# Patient Record
Sex: Male | Born: 1985 | Race: White | Hispanic: No | Marital: Single | Smoking: Never smoker
Health system: Southern US, Community
[De-identification: ages and names within clinical notes are randomized; demographics above are authoritative.]

## PROBLEM LIST (undated history)

## (undated) DIAGNOSIS — J302 Other seasonal allergic rhinitis: Secondary | ICD-10-CM

## (undated) DIAGNOSIS — S96911A Strain of unspecified muscle and tendon at ankle and foot level, right foot, initial encounter: Secondary | ICD-10-CM

## (undated) HISTORY — PX: WISDOM TOOTH EXTRACTION: SHX21

---
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
## 2008-01-31 IMAGING — CR DG SHOULDER 2+V*L*
3 series · 3 of 3 positions shown · non-contrast
Comparison: None available.

CLINICAL DATA: Shoulder pain status post injury.  
 LEFT SHOULDER ? 3 VIEW:

[view not recorded (1 of 3)]
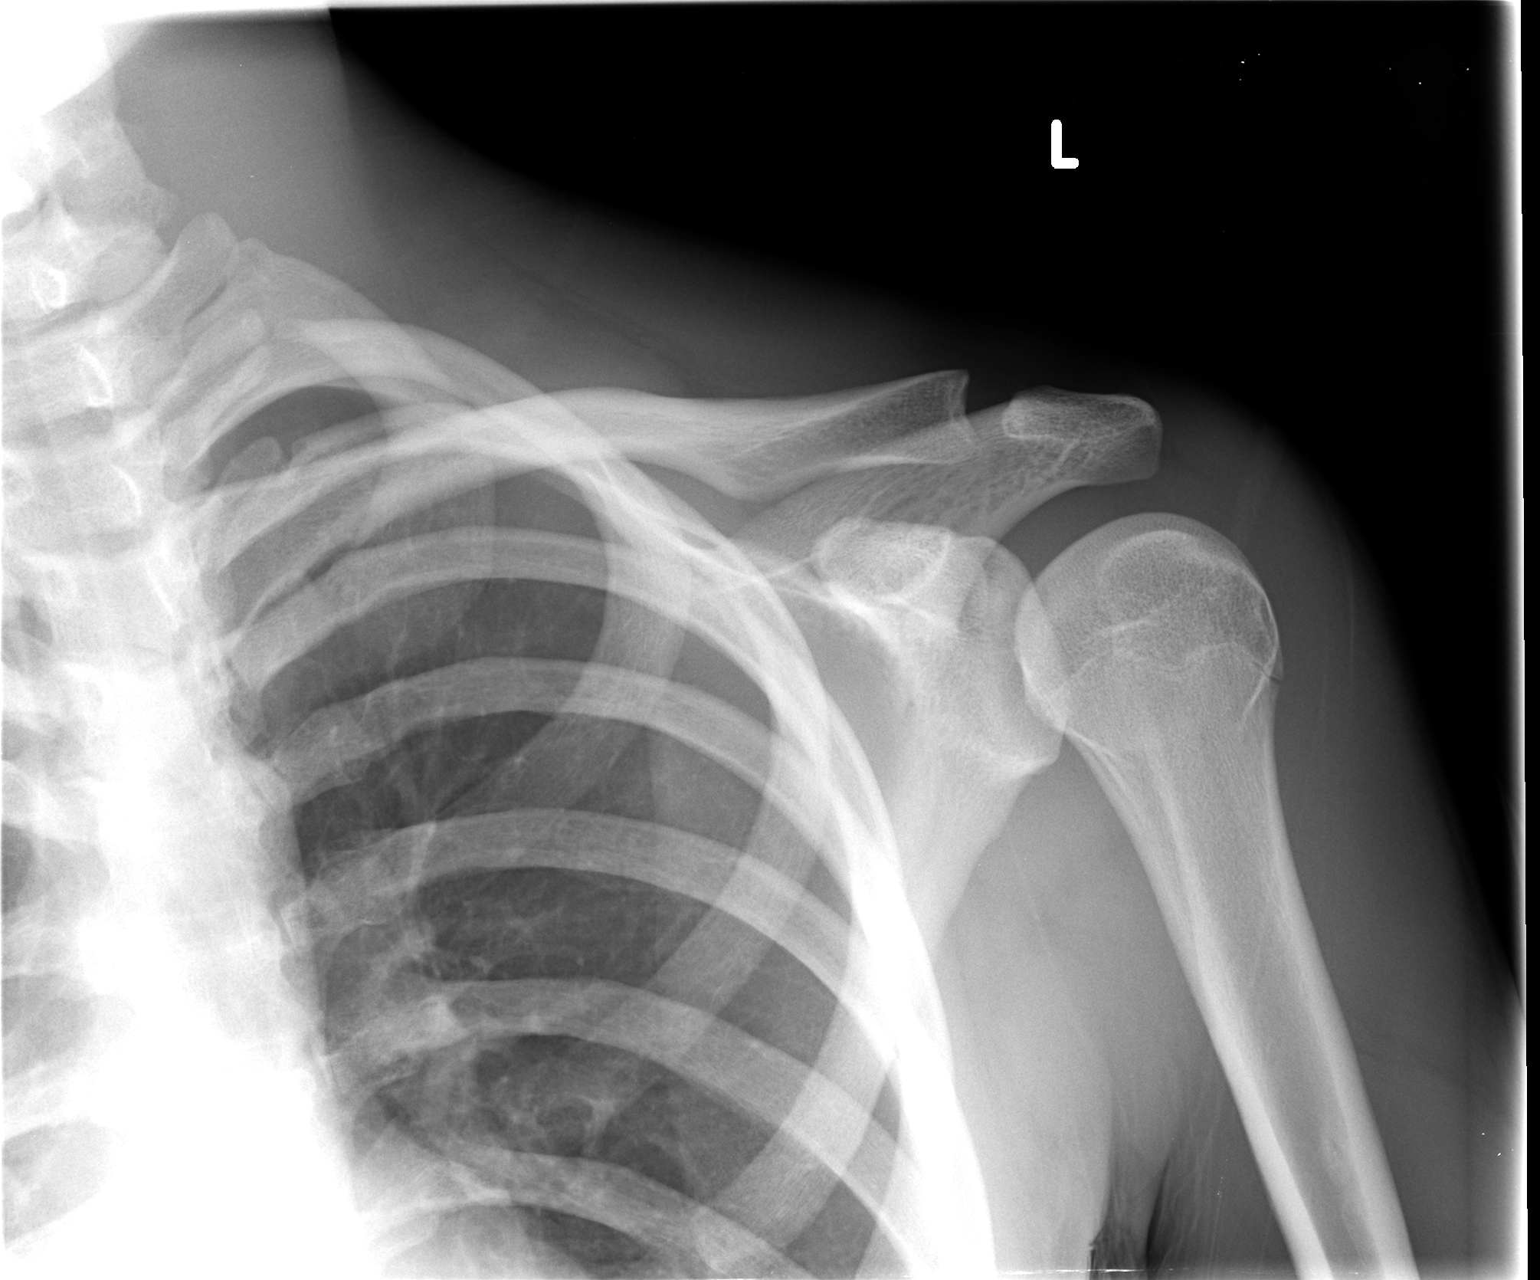

[view not recorded (2 of 3)]
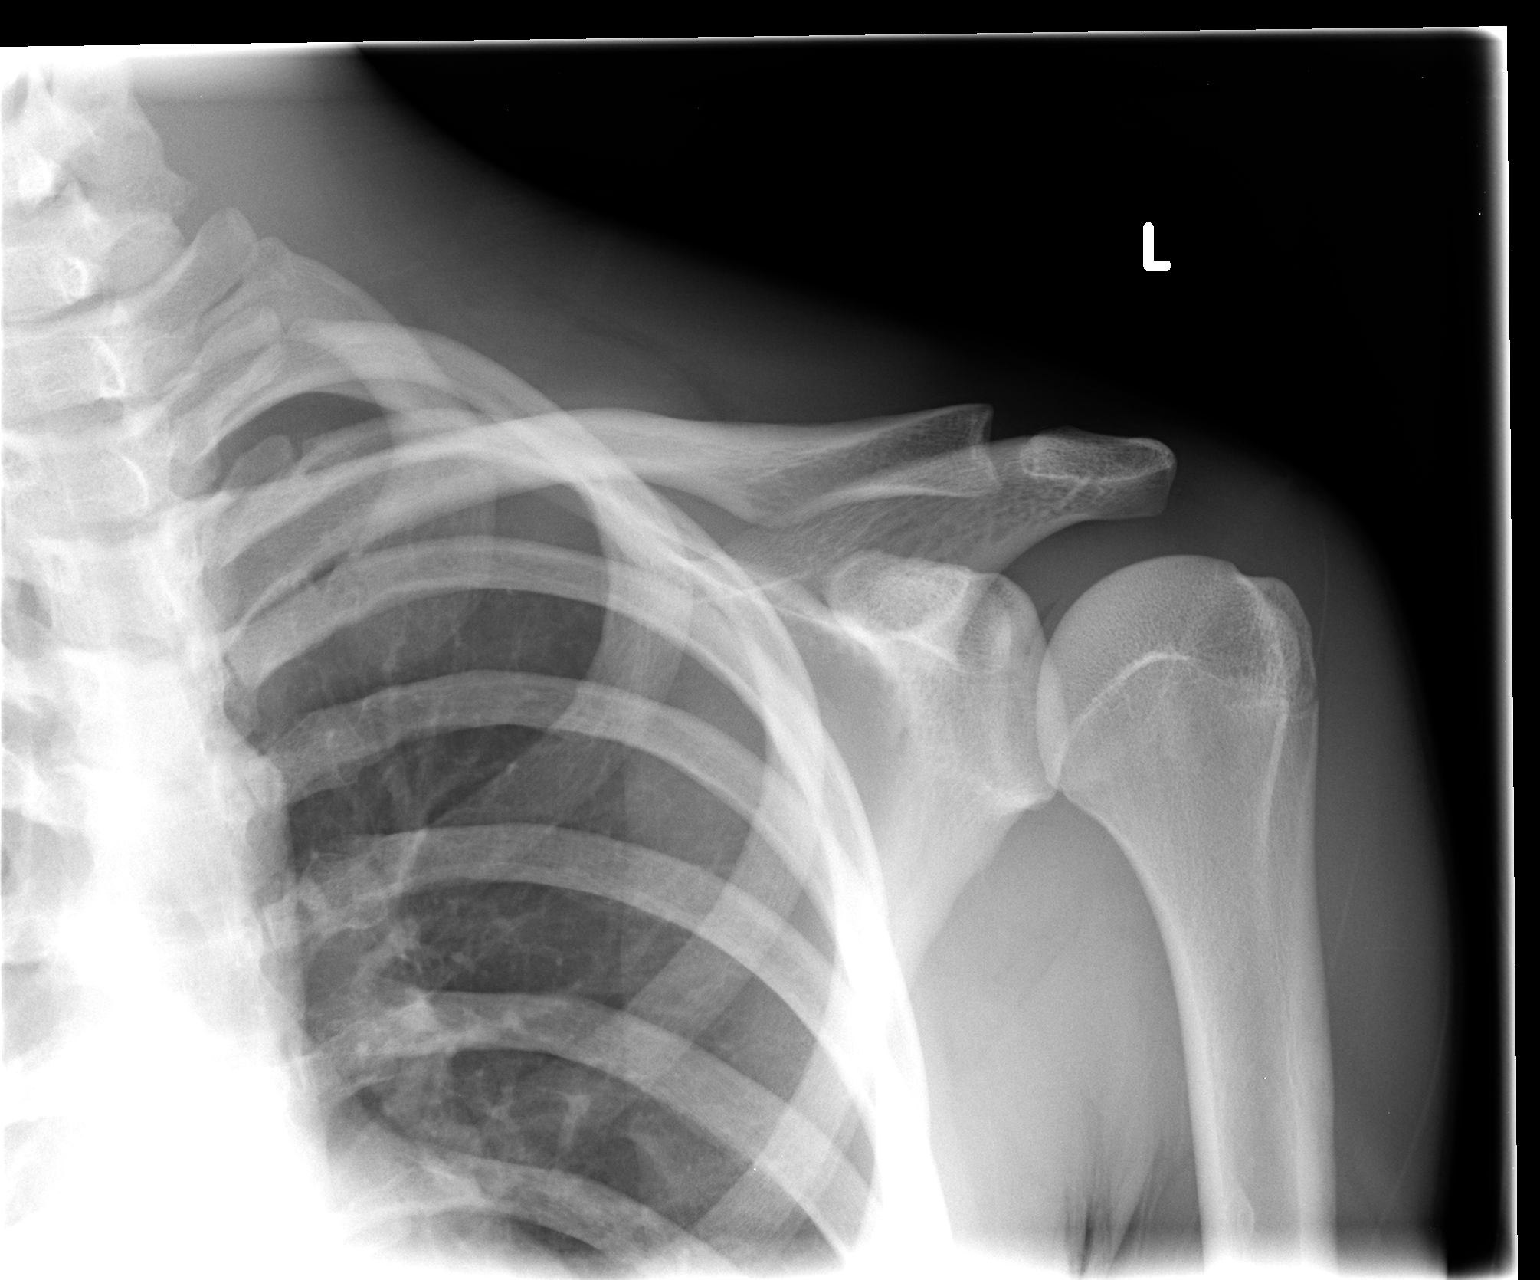

[view not recorded (3 of 3)]
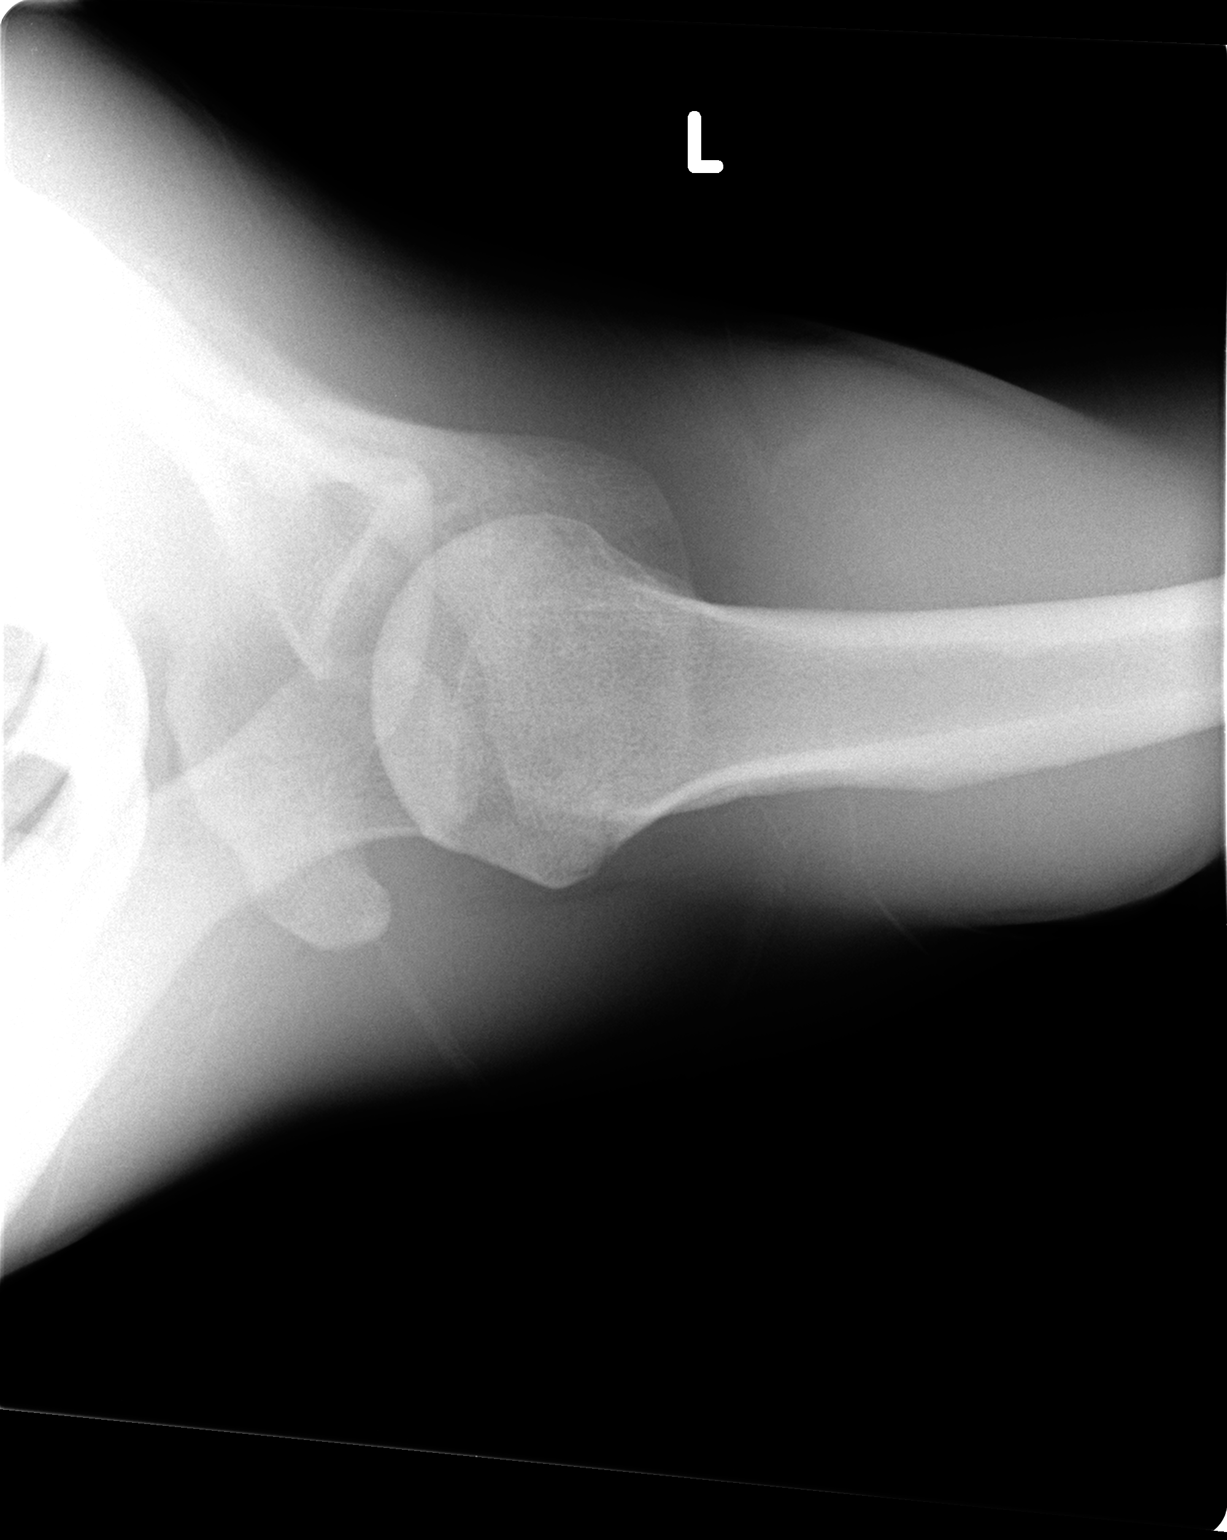

[3 of 3 positions shown; findings below may reference images not displayed]

FINDINGS: No dislocations are identified.  There are no fractures.
IMPRESSION: No acute findings.

## 2014-03-25 DIAGNOSIS — S96911A Strain of unspecified muscle and tendon at ankle and foot level, right foot, initial encounter: Secondary | ICD-10-CM

## 2014-03-25 HISTORY — DX: Strain of unspecified muscle and tendon at ankle and foot level, right foot, initial encounter: S96.911A

## 2014-04-07 ENCOUNTER — Encounter (HOSPITAL_BASED_OUTPATIENT_CLINIC_OR_DEPARTMENT_OTHER): Payer: Self-pay | Admitting: *Deleted

## 2014-04-10 ENCOUNTER — Encounter (HOSPITAL_BASED_OUTPATIENT_CLINIC_OR_DEPARTMENT_OTHER): Payer: Self-pay | Admitting: Certified Registered"

## 2014-04-10 ENCOUNTER — Ambulatory Visit (HOSPITAL_BASED_OUTPATIENT_CLINIC_OR_DEPARTMENT_OTHER): Payer: Worker's Compensation | Admitting: Anesthesiology

## 2014-04-10 ENCOUNTER — Ambulatory Visit (HOSPITAL_BASED_OUTPATIENT_CLINIC_OR_DEPARTMENT_OTHER)
Admission: RE | Admit: 2014-04-10 | Discharge: 2014-04-10 | Disposition: A | Discharge status: Home / Self Care | Payer: Worker's Compensation | Source: Ambulatory Visit | Attending: Orthopedic Surgery | Admitting: Orthopedic Surgery

## 2014-04-10 ENCOUNTER — Encounter (HOSPITAL_BASED_OUTPATIENT_CLINIC_OR_DEPARTMENT_OTHER)
Admission: RE | Disposition: A | Discharge status: Home / Self Care | Payer: Self-pay | Source: Ambulatory Visit | Attending: Orthopedic Surgery

## 2014-04-10 DIAGNOSIS — X58XXXA Exposure to other specified factors, initial encounter: Secondary | ICD-10-CM | POA: Insufficient documentation

## 2014-04-10 DIAGNOSIS — Y990 Civilian activity done for income or pay: Secondary | ICD-10-CM | POA: Diagnosis not present

## 2014-04-10 DIAGNOSIS — S93411A Sprain of calcaneofibular ligament of right ankle, initial encounter: Secondary | ICD-10-CM | POA: Insufficient documentation

## 2014-04-10 DIAGNOSIS — Y9269 Other specified industrial and construction area as the place of occurrence of the external cause: Secondary | ICD-10-CM | POA: Diagnosis not present

## 2014-04-10 DIAGNOSIS — S86311A Strain of muscle(s) and tendon(s) of peroneal muscle group at lower leg level, right leg, initial encounter: Secondary | ICD-10-CM | POA: Diagnosis not present

## 2014-04-10 DIAGNOSIS — S93491A Sprain of other ligament of right ankle, initial encounter: Secondary | ICD-10-CM | POA: Insufficient documentation

## 2014-04-10 HISTORY — DX: Other seasonal allergic rhinitis: J30.2

## 2014-04-10 HISTORY — DX: Strain of unspecified muscle and tendon at ankle and foot level, right foot, initial encounter: S96.911A

## 2014-04-10 HISTORY — PX: ANKLE RECONSTRUCTION: SHX1151

## 2014-04-10 SURGERY — RECONSTRUCTION, ANKLE
Anesthesia: Regional | Site: Ankle | Laterality: Right

## 2014-04-10 MED ORDER — OXYCODONE HCL 5 MG/5ML PO SOLN: 5.0000 mg | Freq: Once | ORAL | Status: DC | PRN

## 2014-04-10 MED ORDER — MIDAZOLAM HCL 2 MG/ML PO SYRP: 12.0000 mg | ORAL_SOLUTION | Freq: Once | ORAL | Status: DC | PRN

## 2014-04-10 MED ORDER — SENNOSIDES 8.6 MG PO TABS: 2.0000 | ORAL_TABLET | Freq: Every day | ORAL | Status: DC

## 2014-04-10 MED ORDER — LACTATED RINGERS IV SOLN
INTRAVENOUS | Status: DC
  Administered 2014-04-10: 10 mL/h via INTRAVENOUS

## 2014-04-10 MED ORDER — DEXAMETHASONE SODIUM PHOSPHATE 10 MG/ML IJ SOLN
INTRAMUSCULAR | Status: DC | PRN
  Administered 2014-04-10: 10 mg via INTRAVENOUS

## 2014-04-10 MED ORDER — CHLORHEXIDINE GLUCONATE 4 % EX LIQD: 60.0000 mL | Freq: Once | CUTANEOUS | Status: DC

## 2014-04-10 MED ORDER — ONDANSETRON HCL 4 MG/2ML IJ SOLN
INTRAMUSCULAR | Status: DC | PRN
  Administered 2014-04-10: 4 mg via INTRAVENOUS

## 2014-04-10 MED ORDER — MIDAZOLAM HCL 2 MG/2ML IJ SOLN
1.0000 mg | INTRAMUSCULAR | Status: DC | PRN
  Administered 2014-04-10: 2 mg via INTRAVENOUS

## 2014-04-10 MED ORDER — SCOPOLAMINE 1 MG/3DAYS TD PT72
1.0000 | MEDICATED_PATCH | TRANSDERMAL | Status: DC
  Administered 2014-04-10: 1.5 mg via TRANSDERMAL

## 2014-04-10 MED ORDER — KETOROLAC TROMETHAMINE 30 MG/ML IJ SOLN: 30.0000 mg | Freq: Once | INTRAMUSCULAR | Status: DC | PRN

## 2014-04-10 MED ORDER — FENTANYL CITRATE 0.05 MG/ML IJ SOLN
INTRAMUSCULAR | Status: AC
  Filled 2014-04-10: qty 2

## 2014-04-10 MED ORDER — BACITRACIN ZINC 500 UNIT/GM EX OINT
TOPICAL_OINTMENT | CUTANEOUS | Status: AC
  Filled 2014-04-10: qty 28.35

## 2014-04-10 MED ORDER — DOCUSATE SODIUM 100 MG PO CAPS: 100.0000 mg | ORAL_CAPSULE | Freq: Two times a day (BID) | ORAL | Status: AC

## 2014-04-10 MED ORDER — OXYCODONE HCL 5 MG PO TABS: 5.0000 mg | ORAL_TABLET | Freq: Once | ORAL | Status: DC | PRN

## 2014-04-10 MED ORDER — MIDAZOLAM HCL 5 MG/5ML IJ SOLN
INTRAMUSCULAR | Status: DC | PRN
  Administered 2014-04-10: 2 mg via INTRAVENOUS

## 2014-04-10 MED ORDER — SODIUM CHLORIDE 0.9 % IV SOLN: INTRAVENOUS | Status: DC

## 2014-04-10 MED ORDER — HYDROMORPHONE HCL 1 MG/ML IJ SOLN: 0.2500 mg | INTRAMUSCULAR | Status: DC | PRN

## 2014-04-10 MED ORDER — BACITRACIN ZINC 500 UNIT/GM EX OINT
TOPICAL_OINTMENT | CUTANEOUS | Status: DC | PRN
  Administered 2014-04-10: 1 via TOPICAL

## 2014-04-10 MED ORDER — PROMETHAZINE HCL 25 MG/ML IJ SOLN: 6.2500 mg | INTRAMUSCULAR | Status: DC | PRN

## 2014-04-10 MED ORDER — BUPIVACAINE-EPINEPHRINE (PF) 0.5% -1:200000 IJ SOLN
INTRAMUSCULAR | Status: AC
  Filled 2014-04-10: qty 30

## 2014-04-10 MED ORDER — 0.9 % SODIUM CHLORIDE (POUR BTL) OPTIME
TOPICAL | Status: DC | PRN
  Administered 2014-04-10: 200 mL

## 2014-04-10 MED ORDER — MIDAZOLAM HCL 2 MG/2ML IJ SOLN
INTRAMUSCULAR | Status: AC
  Filled 2014-04-10: qty 2

## 2014-04-10 MED ORDER — OXYCODONE HCL 5 MG PO TABS: 5.0000 mg | ORAL_TABLET | ORAL | Status: DC | PRN

## 2014-04-10 MED ORDER — VANCOMYCIN HCL IN DEXTROSE 1-5 GM/200ML-% IV SOLN
INTRAVENOUS | Status: AC
  Filled 2014-04-10: qty 200

## 2014-04-10 MED ORDER — PROPOFOL 10 MG/ML IV BOLUS
INTRAVENOUS | Status: DC | PRN
  Administered 2014-04-10: 200 mg via INTRAVENOUS

## 2014-04-10 MED ORDER — CELECOXIB 200 MG PO CAPS: 200.0000 mg | ORAL_CAPSULE | Freq: Two times a day (BID) | ORAL | Status: DC

## 2014-04-10 MED ORDER — SCOPOLAMINE 1 MG/3DAYS TD PT72
MEDICATED_PATCH | TRANSDERMAL | Status: AC
  Filled 2014-04-10: qty 1

## 2014-04-10 MED ORDER — ASPIRIN EC 325 MG PO TBEC: 325.0000 mg | DELAYED_RELEASE_TABLET | Freq: Every day | ORAL | Status: AC

## 2014-04-10 MED ORDER — VANCOMYCIN HCL IN DEXTROSE 1-5 GM/200ML-% IV SOLN
1000.0000 mg | INTRAVENOUS | Status: AC
  Administered 2014-04-10: 1000 mg via INTRAVENOUS

## 2014-04-10 MED ORDER — FENTANYL CITRATE 0.05 MG/ML IJ SOLN
50.0000 ug | INTRAMUSCULAR | Status: DC | PRN
  Administered 2014-04-10: 100 ug via INTRAVENOUS

## 2014-04-10 SURGICAL SUPPLY — 80 items
ANCH SUT 1 SHRT SM RGD INSRTR (Anchor) ×4 IMPLANT
ANCHOR SUT 1.45 SZ 1 SHORT (Anchor) ×6 IMPLANT
BANDAGE ESMARK 6X9 LF (GAUZE/BANDAGES/DRESSINGS) ×2 IMPLANT
BLADE AVERAGE 25X9 (BLADE) IMPLANT
BLADE OSC/SAG .038X5.5 CUT EDG (BLADE) IMPLANT
BLADE SURG 15 STRL LF DISP TIS (BLADE) ×6 IMPLANT
BLADE SURG 15 STRL SS (BLADE) ×9
BNDG CMPR 9X4 STRL LF SNTH (GAUZE/BANDAGES/DRESSINGS)
BNDG CMPR 9X6 STRL LF SNTH (GAUZE/BANDAGES/DRESSINGS) ×2
BNDG COHESIVE 4X5 TAN STRL (GAUZE/BANDAGES/DRESSINGS) ×3 IMPLANT
BNDG COHESIVE 6X5 TAN STRL LF (GAUZE/BANDAGES/DRESSINGS) ×2 IMPLANT
BNDG ESMARK 4X9 LF (GAUZE/BANDAGES/DRESSINGS) IMPLANT
BNDG ESMARK 6X9 LF (GAUZE/BANDAGES/DRESSINGS) ×3
CHLORAPREP W/TINT 26ML (MISCELLANEOUS) ×3 IMPLANT
COVER BACK TABLE 60X90IN (DRAPES) ×3 IMPLANT
CUFF TOURNIQUET SINGLE 34IN LL (TOURNIQUET CUFF) ×3 IMPLANT
DECANTER SPIKE VIAL GLASS SM (MISCELLANEOUS) IMPLANT
DRAPE C-ARM 42X72 X-RAY (DRAPES) IMPLANT
DRAPE C-ARMOR (DRAPES) IMPLANT
DRAPE EXTREMITY T 121X128X90 (DRAPE) ×3 IMPLANT
DRAPE OEC MINIVIEW 54X84 (DRAPES) ×2 IMPLANT
DRAPE U-SHAPE 47X51 STRL (DRAPES) ×3 IMPLANT
DRSG EMULSION OIL 3X3 NADH (GAUZE/BANDAGES/DRESSINGS) ×3 IMPLANT
DRSG PAD ABDOMINAL 8X10 ST (GAUZE/BANDAGES/DRESSINGS) ×6 IMPLANT
ELECT REM PT RETURN 9FT ADLT (ELECTROSURGICAL) ×3
ELECTRODE REM PT RTRN 9FT ADLT (ELECTROSURGICAL) ×2 IMPLANT
GAUZE SPONGE 4X4 12PLY STRL (GAUZE/BANDAGES/DRESSINGS) ×3 IMPLANT
GLOVE BIO SURGEON STRL SZ8 (GLOVE) ×3 IMPLANT
GLOVE BIOGEL PI IND STRL 7.0 (GLOVE) ×1 IMPLANT
GLOVE BIOGEL PI IND STRL 8 (GLOVE) ×2 IMPLANT
GLOVE BIOGEL PI INDICATOR 7.0 (GLOVE) ×1
GLOVE BIOGEL PI INDICATOR 8 (GLOVE) ×1
GLOVE ECLIPSE 6.5 STRL STRAW (GLOVE) ×3 IMPLANT
GLOVE EXAM NITRILE MD LF STRL (GLOVE) ×2 IMPLANT
GOWN STRL REUS W/ TWL LRG LVL3 (GOWN DISPOSABLE) ×2 IMPLANT
GOWN STRL REUS W/ TWL XL LVL3 (GOWN DISPOSABLE) ×2 IMPLANT
GOWN STRL REUS W/TWL LRG LVL3 (GOWN DISPOSABLE) ×3
GOWN STRL REUS W/TWL XL LVL3 (GOWN DISPOSABLE) ×3
K-WIRE .062X4 (WIRE) ×3 IMPLANT
KIT BIO-TENODESIS 3X8 DISP (MISCELLANEOUS)
KIT INSRT BABSR STRL DISP BTN (MISCELLANEOUS) IMPLANT
NDL SUT 6 .5 CRC .975X.05 MAYO (NEEDLE) ×1 IMPLANT
NEEDLE HYPO 22GX1.5 SAFETY (NEEDLE) ×3 IMPLANT
NEEDLE MAYO TAPER (NEEDLE) ×3
NS IRRIG 1000ML POUR BTL (IV SOLUTION) ×3 IMPLANT
PACK BASIN DAY SURGERY FS (CUSTOM PROCEDURE TRAY) ×3 IMPLANT
PAD CAST 4YDX4 CTTN HI CHSV (CAST SUPPLIES) ×2 IMPLANT
PADDING CAST ABS 4INX4YD NS (CAST SUPPLIES)
PADDING CAST ABS COTTON 4X4 ST (CAST SUPPLIES) IMPLANT
PADDING CAST COTTON 4X4 STRL (CAST SUPPLIES) ×3
PADDING CAST COTTON 6X4 STRL (CAST SUPPLIES) ×3 IMPLANT
PASSER SUT SWANSON 36MM LOOP (INSTRUMENTS) IMPLANT
PENCIL BUTTON HOLSTER BLD 10FT (ELECTRODE) ×3 IMPLANT
SANITIZER HAND PURELL 535ML FO (MISCELLANEOUS) ×3 IMPLANT
SCOTCHCAST PLUS 4X4 WHITE (CAST SUPPLIES) IMPLANT
SHEET MEDIUM DRAPE 40X70 STRL (DRAPES) ×3 IMPLANT
SLEEVE SCD COMPRESS KNEE MED (MISCELLANEOUS) ×3 IMPLANT
SPLINT FAST PLASTER 5X30 (CAST SUPPLIES) ×20
SPLINT PLASTER CAST FAST 5X30 (CAST SUPPLIES) ×40 IMPLANT
SPONGE LAP 18X18 X RAY DECT (DISPOSABLE) ×3 IMPLANT
STOCKINETTE 6  STRL (DRAPES) ×1
STOCKINETTE 6 STRL (DRAPES) ×2 IMPLANT
SUCTION FRAZIER TIP 10 FR DISP (SUCTIONS) IMPLANT
SUT ETHIBOND 0 MO6 C/R (SUTURE) ×3 IMPLANT
SUT ETHIBOND 2 OS 4 DA (SUTURE) IMPLANT
SUT ETHILON 3 0 PS 1 (SUTURE) ×3 IMPLANT
SUT FIBERWIRE 2-0 18 17.9 3/8 (SUTURE)
SUT MERSILENE 2.0 SH NDLE (SUTURE) IMPLANT
SUT MNCRL AB 3-0 PS2 18 (SUTURE) ×3 IMPLANT
SUT MNCRL AB 4-0 PS2 18 (SUTURE) IMPLANT
SUT VIC AB 0 SH 27 (SUTURE) ×2 IMPLANT
SUT VIC AB 2-0 SH 18 (SUTURE) IMPLANT
SUT VIC AB 2-0 SH 27 (SUTURE)
SUT VIC AB 2-0 SH 27XBRD (SUTURE) IMPLANT
SUT VICRYL 4-0 PS2 18IN ABS (SUTURE) IMPLANT
SUTURE FIBERWR 2-0 18 17.9 3/8 (SUTURE) IMPLANT
SYR BULB 3OZ (MISCELLANEOUS) ×3 IMPLANT
TOWEL OR 17X24 6PK STRL BLUE (TOWEL DISPOSABLE) ×6 IMPLANT
TUBE CONNECTING 20X1/4 (TUBING) IMPLANT
UNDERPAD 30X30 INCONTINENT (UNDERPADS AND DIAPERS) ×3 IMPLANT

## 2014-04-10 NOTE — Progress Notes (Signed)
Assisted Dr. Rob Fitzgerald with right, ultrasound guided, popliteal block. Side rails up, monitors on throughout procedure. See vital signs in flow sheet. Tolerated Procedure well. 

## 2014-04-10 NOTE — Anesthesia Postprocedure Evaluation (Signed)
  Anesthesia Post-op Note  Patient: Troy Glenn  Procedure(s) Performed: Procedure(s): RIGHT PERONEAL TENDON DEBRIDEMENT AND REPAIR;  PERONEAL RETINACULAR RECONSTRUCTION  (Right)  Patient Location: PACU  Anesthesia Type:GA combined with regional for post-op pain  Level of Consciousness: awake and alert   Airway and Oxygen Therapy: Patient Spontanous Breathing  Post-op Pain: mild  Post-op Assessment: Post-op Vital signs reviewed  Post-op Vital Signs: Reviewed  Last Vitals:  Filed Vitals:   04/10/14 1307  BP:   Pulse: 72  Temp:   Resp: 15    Complications: No apparent anesthesia complications

## 2014-04-10 NOTE — Discharge Instructions (Addendum)
Toni Arthurs, MD [facility]  Please read the following information regarding your care after surgery.  Medications  You only need a prescription for the narcotic pain medicine (ex. oxycodone, Percocet, Norco).  All of the other medicines listed below are available over the counter. X acetominophen (Tylenol) 650 mg every 4-6 hours as you need for minor pain X oxycodone as prescribed for moderate to severe pain X celebrex as prescribed for 5 days   Narcotic pain medicine (ex. oxycodone, Percocet, Vicodin) will cause constipation.  To prevent this problem, take the following medicines while you are taking any pain medicine. X docusate sodium (Colace) 100 mg twice a day X senna (Senokot) 2 tablets twice a day  X To help prevent blood clots, take an aspirin (325 mg) once a day for a month after surgery.  You should also get up every hour while you are awake to move around.    Weight Bearing ? Bear weight when you are able on your operated leg or foot. ? Bear weight only on the heel of your operated foot in the post-op shoe. X Do not bear any weight on the operated leg or foot.  Cast / Splint / Dressing X Keep your splint or cast clean and dry.  Dont put anything (coat hanger, pencil, etc) down inside of it.  If it gets damp, use a hair dryer on the cool setting to dry it.  If it gets soaked, call the office to schedule an appointment for a cast change. ? Remove your dressing 3 days after surgery and cover the incisions with dry dressings.    After your dressing, cast or splint is removed; you may shower, but do not soak or scrub the wound.  Allow the water to run over it, and then gently pat it dry.  Swelling It is normal for you to have swelling where you had surgery.  To reduce swelling and pain, keep your toes above your nose for at least 3 days after surgery.  It may be necessary to keep your foot or leg elevated for several weeks.  If it hurts, it should be  elevated.  Follow Up Call my office at [phone] when you are discharged from the hospital or surgery center to schedule an appointment to be seen two weeks after surgery.  Call my office at [phone] if you develop a fever >101.5 F, nausea, vomiting, bleeding from the surgical site or severe pain.    Regional Anesthesia Blocks  1. Numbness or the inability to move the "blocked" extremity may last from 3-48 hours after placement. The length of time depends on the medication injected and your individual response to the medication. If the numbness is not going away after 48 hours, call your surgeon.  2. The extremity that is blocked will need to be protected until the numbness is gone and the  Strength has returned. Because you cannot feel it, you will need to take extra care to avoid injury. Because it may be weak, you may have difficulty moving it or using it. You may not know what position it is in without looking at it while the block is in effect.  3. For blocks in the legs and feet, returning to weight bearing and walking needs to be done carefully. You will need to wait until the numbness is entirely gone and the strength has returned. You should be able to move your leg and foot normally before you try and bear weight or walk. You  will need someone to be with you when you first try to ensure you do not fall and possibly risk injury.  4. Bruising and tenderness at the needle site are common side effects and will resolve in a few days.  5. Persistent numbness or new problems with movement should be communicated to the surgeon or the [facility] [phone])/ [facility] [phone]).  Post Anesthesia Home Care Instructions  Activity: Get plenty of rest for the remainder of the day. A responsible adult should stay with you for 24 hours following the procedure.  For the next 24 hours, DO NOT: -Drive a car -Advertising copywriterperate machinery -Drink alcoholic  beverages -Take any medication unless instructed by your physician -Make any legal decisions or sign important papers.  Meals: Start with liquid foods such as gelatin or soup. Progress to regular foods as tolerated. Avoid greasy, spicy, heavy foods. If nausea and/or vomiting occur, drink only clear liquids until the nausea and/or vomiting subsides. Call your physician if vomiting continues.  Special Instructions/Symptoms: Your throat may feel dry or sore from the anesthesia or the breathing tube placed in your throat during surgery. If this causes discomfort, gargle with warm salt water. The discomfort should disappear within 24 hours.

## 2014-04-10 NOTE — Anesthesia Preprocedure Evaluation (Addendum)
Anesthesia Evaluation  Patient identified by MRN, date of birth, ID band Patient awake    Reviewed: Allergy & Precautions, NPO status , Patient's Chart, lab work & pertinent test results  Airway Mallampati: II  TM Distance: >3 FB Neck ROM: Full    Dental  (+) Teeth Intact, Dental Advisory Given   Pulmonary neg pulmonary ROS,  breath sounds clear to auscultation        Cardiovascular negative cardio ROS  Rhythm:Regular Rate:Normal     Neuro/Psych negative neurological ROS     GI/Hepatic negative GI ROS, Neg liver ROS,   Endo/Other  negative endocrine ROS  Renal/GU negative Renal ROS     Musculoskeletal   Abdominal   Peds  Hematology negative hematology ROS (+)   Anesthesia Other Findings   Reproductive/Obstetrics                            Anesthesia Physical Anesthesia Plan  ASA: I  Anesthesia Plan: General and Regional   Post-op Pain Management:    Induction: Intravenous  Airway Management Planned: LMA  Additional Equipment:   Intra-op Plan:   Post-operative Plan: Extubation in OR  Informed Consent: I have reviewed the patients History and Physical, chart, labs and discussed the procedure including the risks, benefits and alternatives for the proposed anesthesia with the patient or authorized representative who has indicated his/her understanding and acceptance.   Dental advisory given  Plan Discussed with: CRNA  Anesthesia Plan Comments:         Anesthesia Quick Evaluation

## 2014-04-10 NOTE — Anesthesia Procedure Notes (Addendum)
Anesthesia Regional Block:  Popliteal block  Pre-Anesthetic Checklist: ,, timeout performed, Correct Patient, Correct Site, Correct Laterality, Correct Procedure, Correct Position, site marked, Risks and benefits discussed,  Surgical consent,  Pre-op evaluation,  At surgeon's request and post-op pain management  Laterality: Right  Prep: chloraprep       Needles:  Injection technique: Single-shot  Needle Type: Echogenic Stimulator Needle     Needle Length: 9cm 9 cm Needle Gauge: 21 and 21 G    Additional Needles:  Procedures: ultrasound guided (picture in chart) and nerve stimulator Popliteal block  Nerve Stimulator or Paresthesia:  Response: tibial, 0.5 mA,  Response: peroneal, 0.5 mA,   Additional Responses:   Narrative:  Start time: 04/10/2014 10:00 AM End time: 04/10/2014 10:08 AM Injection made incrementally with aspirations every 5 mL.  Performed by: Personally  Anesthesiologist: Marcene DuosFITZGERALD, ROBERT  Additional Notes: Risks benefits explained. Pt tolerated procedure well with no immediate complications.   Procedure Name: LMA Insertion Date/Time: 04/10/2014 10:55 AM Performed by: Arshdeep Bolger D Pre-anesthesia Checklist: Patient identified, Emergency Drugs available, Suction available and Patient being monitored Patient Re-evaluated:Patient Re-evaluated prior to inductionOxygen Delivery Method: Circle System Utilized Preoxygenation: Pre-oxygenation with 100% oxygen Intubation Type: IV induction Ventilation: Mask ventilation without difficulty LMA: LMA inserted LMA Size: 4.0 Number of attempts: 1 Airway Equipment and Method: Bite block Placement Confirmation: positive ETCO2 Tube secured with: Tape Dental Injury: Teeth and Oropharynx as per pre-operative assessment

## 2014-04-10 NOTE — H&P (Signed)
Troy Glenn is an 29 y.o. male.   Chief Complaint: right ankle pain HPI: 29 y/o male with PMH of right ankle injury at work x 2 last year.  He has developed dislocating peroneal tendons and a peroneal tendon tear.  He has failed non operative treatment and presents now or surgery to correct these painful conditions.  Past Medical History  Diagnosis Date  . Tear of tendon of right ankle 03/2014    peroneus tendon tear, subluxing peroneal tendon  . Seasonal allergies     Past Surgical History  Procedure Laterality Date  . Shoulder arthroscopy Left 12/2005  . Wisdom tooth extraction      History reviewed. No pertinent family history. Social History:  reports that he has never smoked. He has never used smokeless tobacco. He reports that he does not drink alcohol or use illicit drugs.  Allergies:  Allergies  Allergen Reactions  . Ceclor [Cefaclor] Hives    No prescriptions prior to admission    No results found for this or any previous visit (from the past 48 hour(s)). No results found.  ROS  No recent f/c/nv//wt loss  Blood pressure 138/71, pulse 67, temperature 98.7 F (37.1 C), temperature source Oral, resp. rate 20, height 5' 9.5" (1.765 m), weight 78.744 kg (173 lb 9.6 oz), SpO2 100 %. Physical Exam  wn wd male in nad.  A and O x 4.  Mood and affect normal.  EOMI.  resp unlabored.  R ankle with healthy skin.  Peroneals have 5/5 strength but subluxate with resisted strength testing.  TTP at peroneals.  Skin healthy and intact.  No lymphadenoapthy.  Normal NV exam.  Assessment/Plan R ankle dislocating peroneal tendons and peroneal tendon tear.  To or for reconstruction of the SPR and tendon debridement or repair of the peroneals.  The risks and benefits of the alternative treatment options have been discussed in detail.  The patient wishes to proceed with surgery and specifically understands risks of bleeding, infection, nerve damage, blood clots, need for additional surgery,  amputation and death.   Toni ArthursHEWITT, Revella Shelton 04/10/2014, 9:49 AM

## 2014-04-10 NOTE — Brief Op Note (Signed)
04/10/2014  12:18 PM  PATIENT:  Delma Officerathan Lipuma  29 y.o. male  PRE-OPERATIVE DIAGNOSIS: 1.  Right peroneus brevis tendon tear      2.  Dislocating peroneal tendons  POST-OPERATIVE DIAGNOSIS: 1.  Right dislocating peroneal tendons (from avulsion of the superior peroneal retinaculum)      2.  Tenosynovitis of peroneus longus and peroneus brevis tendons      3.  Right ankle lateral instability (avulsion of ATFL and CFL)  Procedure(s): 1.  Right peroneus brevis tenolysis   2.  Right peroneus longus tenolysis   3.  Repair of dislocating peroneal tendons (reconstruction of superior peroneal retinaculum)   4.  Secondary repair of lateral ankle ligaments (avulsed ATFL and CFL)  SURGEON:  Toni ArthursJohn Naol Ontiveros, MD  ASSISTANT: n/a  ANESTHESIA:   General, regional  EBL:  minimal   TOURNIQUET:   Total Tourniquet Time Documented: Thigh (Right) - 54 minutes Total: Thigh (Right) - 54 minutes  COMPLICATIONS:  None apparent  DISPOSITION:  Extubated, awake and stable to recovery.  DICTATION ID:  [id number]

## 2014-04-10 NOTE — Transfer of Care (Signed)
Immediate Anesthesia Transfer of Care Note  Patient: Troy Glenn  Procedure(s) Performed: Procedure(s): RIGHT PERONEAL TENDON DEBRIDEMENT AND REPAIR;  POSSIBLE FIBULAR GROOVE DEEPENING AND PERONEAL RETINACULAR RECONSTRUCTION  (Right)  Patient Location: PACU  Anesthesia Type:GA combined with regional for post-op pain  Level of Consciousness: awake and patient cooperative  Airway & Oxygen Therapy: Patient Spontanous Breathing and Patient connected to face mask oxygen  Post-op Assessment: Report given to RN and Post -op Vital signs reviewed and stable  Post vital signs: Reviewed and stable  Last Vitals:  Filed Vitals:   04/10/14 1212  BP:   Pulse: 79  Temp:   Resp: 10    Complications: No apparent anesthesia complications

## 2014-04-11 ENCOUNTER — Encounter (HOSPITAL_BASED_OUTPATIENT_CLINIC_OR_DEPARTMENT_OTHER): Payer: Self-pay | Admitting: Orthopedic Surgery

## 2014-04-11 NOTE — Op Note (Signed)
NAME:  Troy Glenn, Troy Glenn                ACCOUNT NO.:  [id number]  MEDICAL RECORD NO.:  [id number]  LOCATION:                                 FACILITY:  PHYSICIAN:  Troy ArthursJohn Roxi Hlavaty, MD             DATE OF BIRTH:  DATE OF PROCEDURE:  04/10/2014 DATE OF DISCHARGE:                              OPERATIVE REPORT   PREOPERATIVE DIAGNOSES: 1. Right peroneus brevis tendon tear. 2. Dislocating peroneal tendons.  POSTOPERATIVE DIAGNOSES: 1. Right dislocating peroneal tendons from an evulsion of the superior     peroneal retinaculum. 2. Tenosynovitis of the peroneus brevis and peroneus longus tendons. 3. Right ankle lateral ligament instability from avulsion of the     anterior talofibular ligament and the calcaneofibular ligament.  PROCEDURES: 1. Right peroneus brevis tenolysis. 2. Right peroneus longus tenolysis. 3. Repair of dislocating peroneal tendons with reconstruction of the     superior peroneal retinaculum. 4. Secondary repair of the lateral ankle ligaments (avulsed anterior     talofibular ligament and calcaneofibular ligament).  SURGEON:  Troy ArthursJohn Genavie Boettger, MD  ANESTHESIA:  General, regional.  ESTIMATED BLOOD LOSS:  Minimal.  TOURNIQUET TIME:  54 minutes at 250 mmHg.  COMPLICATIONS:  None apparent.  DISPOSITION:  Extubated, awake and stable to recovery.  INDICATIONS FOR PROCEDURE:  The patient is a 29 year old male who injured his right ankle at work on two different occasions last year. He has gone onto have signs and symptoms consistent with dislocating peroneal tendons.  He has failed nonoperative treatment to date including physical therapy and bracing.  An MRI shows injury to the superior peroneal retinaculum as well as peroneus brevis tendon tear. He understands the risks and benefits, the alternative treatment options and elects surgical treatment.  He specifically understands risks of bleeding, infection, nerve damage, blood clots, need for additional surgery, continued  pain, amputation, and death.  PROCEDURE IN DETAIL:  After preoperative consent was obtained and the correct operative site was identified, the patient was brought to the operating room and placed supine on the operating table.  General anesthesia was induced.  Preoperative antibiotics were administered. Surgical time-out was taken.  The right lower extremity was prepped and draped in standard sterile fashion and tourniquet around the thigh. Extremity was exsanguinated and tourniquet was inflated to 250 mmHg.  A curvilinear incision was made over the lateral malleolus.  Sharp dissection was carried down through the skin and subcutaneous tissue. The peroneal tendon sheath was incised.  The superior peroneal retinaculum was released from the posterior edge of the fibula sharply. The entire periosteal sleeve distally including the origins of the anterior talofibular ligament and calcaneofibular ligament along with the insertion of the superior peroneal retinaculum was noted to be avulsed from the distal fibula.  The peroneal tendons could easily be dislocated into this avulsed periosteum.  The distal portion of the fibula was decorticated.  The peroneus brevis and longus tendons were then carefully inspected.  They were noted to have extensive tenosynovitis over both tendons longitudinally.  The patient was also noted to have a peroneus quartus anatomic variant.  The peroneus quartus muscle and tendon both appeared healthy and  intact.  The longus and brevis tendons were noted to be healthy and intact with no evidence of tearing.  Both had extensive synovitis, which was sharply excised with scissors and scalpel.  The ATFL and CFL were again noted to be avulsed from the distal fibula at their origins.  The decision was made to proceed with lateral ligament reconstruction in an effort to retention these avulsed ligaments.  The anterior fibula as well as distal fibular were decorticated with a  rongeur and curette.  Two Bi[employer] JuggerKnot #1 anchors were inserted, one at the origin of the ATFL and one at the origin of the CFL.  Both were noted to have excellent purchase. Horizontal mattress sutures were then passed into the ATFL and CFL.  The ankle was held in a reduced position and the sutures were tightened down pulling the origin of the ATFL and origin of the CFL back up on to the appropriate location on the distal fibula.  These sutures were then passed through the extensor retinaculum and the retinaculum was advanced up on to the anterior aspect of the fibula as well.  The superior peroneal retinaculum was then repaired back to the trailing edge of the fibula through drill holes.  The fibular groove was noted to be appropriately deep, so fibular osteotomy was unnecessary.  With the retinaculum repaired, there was no evidence of dislocation or subluxation of the peroneal tendons.  The wound was irrigated copiously. The avulsed periosteum was then repaired to the deep subcutaneous tissue overlying the retinacular repair.  This pulled the fibular periosteum securely back down to the bone.  The superficial subcutaneous tissue was then approximated with inverted simple sutures of 3-0 Monocryl and a running 3-0 nylon was used to close the skin incision.  Sterile dressings were applied followed by well-padded short-leg splint.  The patient's previously grade 1 anterior drawer was reduced to grade 0 prior to application of the splint.  The patient's tourniquet was released at 54 minutes after application of the dressings.  The patient was awakened from anesthesia and transported to the recovery room in stable condition.  FOLLOWUP PLAN:  The patient will be nonweightbearing on the right lower extremity.  He will follow up with me in 2 weeks in the office for suture removal and conversion to a short-leg cast.  The patient will start aspirin for DVT prophylaxis while he is  nonweightbearing.     Troy Arthurs, MD     JH/MEDQ  D:  04/10/2014  T:  04/11/2014  Job:  [id number]

## 2015-02-20 ENCOUNTER — Ambulatory Visit: Payer: Self-pay | Admitting: Family Medicine

## 2015-02-26 ENCOUNTER — Other Ambulatory Visit (HOSPITAL_COMMUNITY): Payer: Self-pay | Admitting: Sports Medicine

## 2015-02-26 DIAGNOSIS — M546 Pain in thoracic spine: Secondary | ICD-10-CM

## 2015-03-04 ENCOUNTER — Encounter (HOSPITAL_COMMUNITY): Payer: Self-pay

## 2015-03-06 ENCOUNTER — Ambulatory Visit: Payer: Self-pay | Admitting: Family Medicine

## 2015-03-16 ENCOUNTER — Encounter (HOSPITAL_COMMUNITY)
Admission: RE | Admit: 2015-03-16 | Discharge: 2015-03-16 | Disposition: A | Discharge status: Home / Self Care | Payer: Worker's Compensation | Source: Ambulatory Visit | Attending: Sports Medicine | Admitting: Sports Medicine

## 2015-03-16 ENCOUNTER — Other Ambulatory Visit (HOSPITAL_COMMUNITY): Payer: Self-pay | Admitting: Sports Medicine

## 2015-03-16 DIAGNOSIS — M546 Pain in thoracic spine: Secondary | ICD-10-CM | POA: Insufficient documentation

## 2015-03-16 MED ORDER — TECHNETIUM TC 99M MEDRONATE IV KIT
26.1000 | PACK | Freq: Once | INTRAVENOUS | Status: AC | PRN
  Administered 2015-03-16: 26.1 via INTRAVENOUS

## 2015-03-20 ENCOUNTER — Ambulatory Visit: Payer: Self-pay | Admitting: Family Medicine

## 2015-04-21 ENCOUNTER — Ambulatory Visit: Payer: Self-pay | Admitting: Sports Medicine

## 2015-05-12 ENCOUNTER — Encounter: Payer: Self-pay | Admitting: Sports Medicine

## 2015-05-12 ENCOUNTER — Ambulatory Visit (INDEPENDENT_AMBULATORY_CARE_PROVIDER_SITE_OTHER): Payer: BLUE CROSS/BLUE SHIELD | Admitting: Sports Medicine

## 2015-05-12 VITALS — BP 100/70 | Ht 71.0 in | Wt 180.0 lb

## 2015-05-12 DIAGNOSIS — G2589 Other specified extrapyramidal and movement disorders: Secondary | ICD-10-CM | POA: Diagnosis not present

## 2015-05-12 MED ORDER — AMITRIPTYLINE HCL 25 MG PO TABS: 25.0000 mg | ORAL_TABLET | Freq: Every day | ORAL | Status: AC

## 2015-05-12 NOTE — Progress Notes (Signed)
Patient ID: Troy Glenn, male   DOB: September 07, 1985, 30 y.o.   MRN: [id number]  CC: Right scapular pain  HPI: 30 yo man presents with two years of right posterior shoulder pain, stiffness, burning and reduced range of motion. Symptoms with overhead motion, reaching, lifting or even repetitive movements in front of body such as washing dishes cause pain and burning over medial border of right scapula. Denies frank weakness. Had a lifting injury about 2 years ago which caused back pain and felt a pop in that area. Believes his symptoms are from that. He has tried NSAIDs briefly, scapular stabilization rehab on his own with pulldowns, rows, etc. and massage, none of which have helped. Occasionally has to lift at work now but not often. No longer lifts weights due to injury.  He denies pain or numbness/tingling radiating into his arm or hand. No left shoulder symptoms. No constitutional symptoms.   ROS: As above, otherwise feels well today.  PMH: Seasonal allergies, left shoulder surgery, right ankle surgery  PE: BP 100/70 mmHg  Ht 5\' 11"  (1.803 m)  Wt 180 lb (81.647 kg)  BMI 25.12 kg/m2   GEN: WDWN, athletic man, NAD. Pleasant and cooperative. CV/PULM: Nonlabored breathing, skin is warm, no edema, peripheral circulation grossly intact. NEURO: Symmetrical facial movement and full upper limb strength. UE sensation intact to light tough.  MSK: Most notable finding is right scapular dyskinesis, which appears to catch at about 110 degrees of abduction, and at same relative point upon adduction back to neutral. This abnormal motion is corrected with scapular compression/stabliziation maneuver. No winging or atrophy. Moderate hypertonicity of paraspinal/right medial scapular and trapezius musculature, but no specific abnormality upon palpation. Overlying skin is normal. Provocative tests of rotator cuff do not cause pain. Normal cervical spine and shoulder ROM. No significant scoliosis noted.   A/P: Right  scapular dyskinesis. Possible adhesion and/or nerve compression/stretch from partial tendon/muscle/fascial tear anterior to scapula at time of injury 2 years ago. Trial of medication and exercise as below, return in 6 weeks.  Exercises prescribed and demonstrated Full shoulder lateral rotation/ shoulder blade against the wall Superior/ inferior scapular motion with blade agaisnt the wall Ball or roller between shoulder blades and stretch shoulders to the floor Do 3 stretches of 30 secs  At night time take 25 mg. Amitriptyline  -strong muscle relaxant and blocks nerve irritation. Medication side effects discussed.

## 2015-05-12 NOTE — Patient Instructions (Signed)
You have scapular dyskinesis This means that at some point you injured muscle/ tendon and probably stretched the nerve that helps stabilize the scapula  Exercises Full shoulder lateral rotation/ shoulder blade against the wall Superior/ inferior scapular motion with blade agaisnt the wall Ball or roller between shoulder blades and stretch shoulders to the floor Do 3 stretches of 30 secs  At night time take 25 mg. Amitriptyline  -strong muscle relaxant and blocks nerve irritation  See us in 6 weeks

## 2015-05-12 NOTE — Assessment & Plan Note (Signed)
See plan in note

## 2015-07-01 ENCOUNTER — Ambulatory Visit: Payer: Self-pay | Admitting: Sports Medicine

## 2015-07-29 ENCOUNTER — Ambulatory Visit (INDEPENDENT_AMBULATORY_CARE_PROVIDER_SITE_OTHER): Payer: BLUE CROSS/BLUE SHIELD | Admitting: Sports Medicine

## 2015-07-29 ENCOUNTER — Encounter: Payer: Self-pay | Admitting: Sports Medicine

## 2015-07-29 VITALS — BP 140/94 | HR 95 | Ht 71.0 in | Wt 180.0 lb

## 2015-07-29 DIAGNOSIS — S73191A Other sprain of right hip, initial encounter: Secondary | ICD-10-CM | POA: Diagnosis not present

## 2015-07-29 DIAGNOSIS — M25551 Pain in right hip: Secondary | ICD-10-CM | POA: Insufficient documentation

## 2015-07-29 DIAGNOSIS — G2589 Other specified extrapyramidal and movement disorders: Secondary | ICD-10-CM | POA: Diagnosis not present

## 2015-07-29 NOTE — Assessment & Plan Note (Signed)
He has significant improvement He should continue the exercises at least 3 times a week for scapular stabilization and for posture  This should resolve and not leave him any limitations He may stop medications

## 2015-07-29 NOTE — Patient Instructions (Signed)
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

## 2015-07-29 NOTE — Progress Notes (Signed)
Patient ID: Troy Glenn, male   DOB: [birth year], 30 y.o.   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 This is much improved In the past month he perhaps had pain on 2 days Much less clicking around the shoulder No nighttime pain No limitations of his normal activity  A new problem that has bothered him off and on over the past year is right hip tightness This is the lateral aspect of the hip This started after he had a reconstruction of an ankle ligament about 18 months ago  Review of systems No neck pain No radicular symptoms into his arms No sciatic pain No night pain from his hip  Physical examination Muscular white male in no acute distress BP 140/94 mmHg  Pulse 95  Ht 5\' 11"  (1.803 m)  Wt 180 lb (81.647 kg)  BMI 25.12 kg/m2  RT Shoulder: Inspection reveals no abnormalities, atrophy or asymmetry. Palpation is normal with no tenderness over AC joint or bicipital groove. ROM is full in all planes. Rotator cuff strength normal throughout. No signs of impingement with negative Neer and Hawkin's tests, empty can. Speeds and Yergason's tests normal. No labral pathology noted with negative Obrien's, negative clunk and good stability. Normal scapular function observed. No painful arc and no drop arm sign. No apprehension sign  Biggest changes are that his scapular motion has normalized and he has resolved the scapular protraction  Right hip examination Full range of motion No tenderness over the piriformis No tenderness over the greater trochanter Significant weakness of the right gluteus medius This is not present on the left side All other hip muscles are strong

## 2015-07-29 NOTE — Assessment & Plan Note (Signed)
I suspect while he had the ankle issues and while he recovered from surgery he stopped using his gluteus medius Now he has residual weakness and is compensating by using other muscle groups  I think this will improve dramatically with a home exercise program Recheck in 2 months

## 2015-09-29 ENCOUNTER — Ambulatory Visit: Payer: Self-pay | Admitting: Sports Medicine

## 2015-10-27 ENCOUNTER — Encounter: Payer: Self-pay | Admitting: Sports Medicine

## 2015-10-27 ENCOUNTER — Ambulatory Visit (INDEPENDENT_AMBULATORY_CARE_PROVIDER_SITE_OTHER): Payer: BLUE CROSS/BLUE SHIELD | Admitting: Sports Medicine

## 2015-10-27 DIAGNOSIS — G2589 Other specified extrapyramidal and movement disorders: Secondary | ICD-10-CM | POA: Diagnosis not present

## 2015-10-27 DIAGNOSIS — M25551 Pain in right hip: Secondary | ICD-10-CM | POA: Diagnosis not present

## 2015-10-27 NOTE — Assessment & Plan Note (Signed)
Scap stabilization exercises seem to have worked  Keep these up at least 3 x week  Be caustious with lifts overhead  Reck prn

## 2015-10-27 NOTE — Progress Notes (Signed)
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 Palpation is normal with no tenderness over AC joint or bicipital groove. ROM is full in all planes. Rotator cuff strength normal throughout. No signs of impingement with negative Neer and Hawkin's tests, empty can. Speeds and Yergason's tests normal. No labral pathology noted with negative Obrien's, negative clunk and good stability. Normal scapular function observed. No painful arc and no drop arm sign. No apprehension sign  RT Hip Normal ROM No pain with stress Hip flexion is strong Hip abduction now strong No pain over glut med

## 2015-10-27 NOTE — Assessment & Plan Note (Signed)
This is glut med syndrome  Gained a lot of strength with exercises Keep up exercises at least 3 x week  Afraid to run but I think much of this is fear from prior ankle surgery Can stay as active as pain allows

## 2016-07-01 DIAGNOSIS — Z130 Encounter for screening for diseases of the blood and blood-forming organs and certain disorders involving the immune mechanism: Secondary | ICD-10-CM | POA: Diagnosis not present

## 2016-07-01 DIAGNOSIS — Z6826 Body mass index (BMI) 26.0-26.9, adult: Secondary | ICD-10-CM | POA: Diagnosis not present

## 2016-07-01 DIAGNOSIS — Z0000 Encounter for general adult medical examination without abnormal findings: Secondary | ICD-10-CM | POA: Diagnosis not present

## 2017-07-20 DIAGNOSIS — Z0000 Encounter for general adult medical examination without abnormal findings: Secondary | ICD-10-CM | POA: Diagnosis not present

## 2017-07-20 DIAGNOSIS — Z881 Allergy status to other antibiotic agents status: Secondary | ICD-10-CM | POA: Diagnosis not present

## 2018-02-02 IMAGING — NM NM BONE WHOLE BODY
2 series · 2 of 2 positions shown · non-contrast
Comparison: None.

CLINICAL DATA: Chronic thoracic spine pain and right shoulder and
scapula pain.

EXAM:
NUCLEAR MEDICINE WHOLE BODY BONE SCAN
TECHNIQUE: Whole body anterior and posterior images were obtained approximately
3 hours after intravenous injection of radiopharmaceutical.
RADIOPHARMACEUTICALS:  26.1 mCi Fechnetium-SSm MDP IV

[Series 1: whole body · 2.66mm/px · 1 of 1 slices shown (1 of 2)]
[im 1/1]
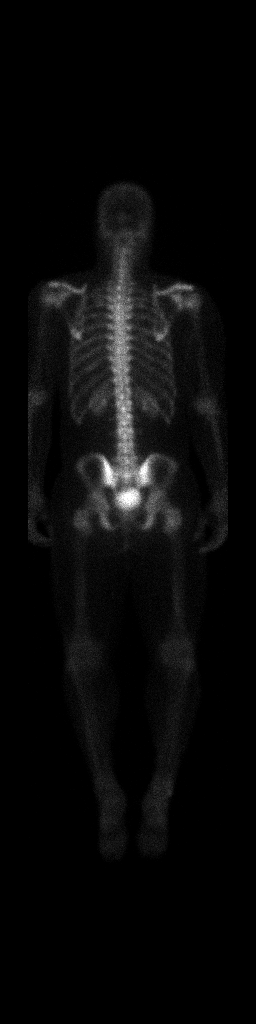

[Series 1: whole body · 2.66mm/px · 1 of 1 slices shown (2 of 2)]
[im 1/1]
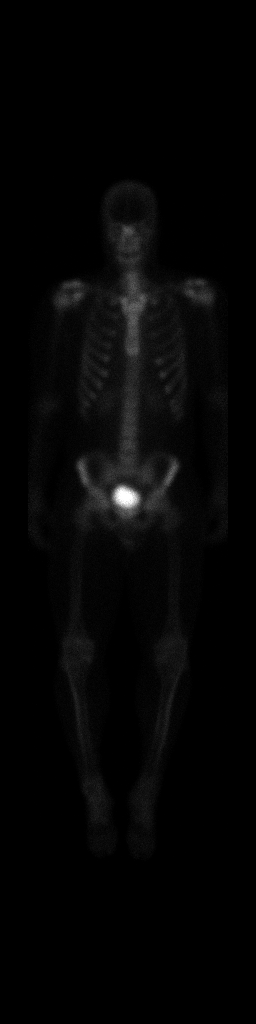

[2 of 2 positions shown; findings below may reference images not displayed]

FINDINGS: There are no abnormal areas of increased or decreased activity in
the skeleton. Specifically, the thoracic spine, ribs, sternum,
scapulae and the other portions of both shoulders appear normal.
There is a slight curvature of thoracic spine which may be
positional.

Normal renal activity.
IMPRESSION: Normal bone scan. Specifically, no evidence of abnormality of the
thoracic spine or shoulders.

## 2018-10-11 DIAGNOSIS — Z13220 Encounter for screening for lipoid disorders: Secondary | ICD-10-CM | POA: Diagnosis not present

## 2018-10-11 DIAGNOSIS — Z6826 Body mass index (BMI) 26.0-26.9, adult: Secondary | ICD-10-CM | POA: Diagnosis not present

## 2018-10-11 DIAGNOSIS — Z130 Encounter for screening for diseases of the blood and blood-forming organs and certain disorders involving the immune mechanism: Secondary | ICD-10-CM | POA: Diagnosis not present

## 2018-10-11 DIAGNOSIS — Z0000 Encounter for general adult medical examination without abnormal findings: Secondary | ICD-10-CM | POA: Diagnosis not present

## 2019-05-31 DIAGNOSIS — Z125 Encounter for screening for malignant neoplasm of prostate: Secondary | ICD-10-CM | POA: Diagnosis not present

## 2019-05-31 DIAGNOSIS — Z0000 Encounter for general adult medical examination without abnormal findings: Secondary | ICD-10-CM | POA: Diagnosis not present

## 2019-05-31 DIAGNOSIS — Z13220 Encounter for screening for lipoid disorders: Secondary | ICD-10-CM | POA: Diagnosis not present

## 2019-05-31 DIAGNOSIS — Z1329 Encounter for screening for other suspected endocrine disorder: Secondary | ICD-10-CM | POA: Diagnosis not present

## 2020-05-14 DIAGNOSIS — Z6827 Body mass index (BMI) 27.0-27.9, adult: Secondary | ICD-10-CM | POA: Diagnosis not present

## 2020-05-14 DIAGNOSIS — Z0000 Encounter for general adult medical examination without abnormal findings: Secondary | ICD-10-CM | POA: Diagnosis not present

## 2020-05-14 DIAGNOSIS — R2991 Unspecified symptoms and signs involving the musculoskeletal system: Secondary | ICD-10-CM | POA: Diagnosis not present

## 2020-05-14 DIAGNOSIS — R17 Unspecified jaundice: Secondary | ICD-10-CM | POA: Diagnosis not present
